# Patient Record
Sex: Male | Born: 1990 | Race: Black or African American | Hispanic: No | Marital: Married | Smoking: Current every day smoker
Health system: Southern US, Community
[De-identification: ages and names within clinical notes are randomized; demographics above are authoritative.]

---
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
## 2010-08-07 ENCOUNTER — Emergency Department: Payer: Self-pay | Admitting: Emergency Medicine

## 2011-02-04 ENCOUNTER — Emergency Department: Payer: Self-pay | Admitting: Emergency Medicine

## 2017-11-25 ENCOUNTER — Encounter: Payer: Self-pay | Admitting: Emergency Medicine

## 2017-11-25 ENCOUNTER — Emergency Department: Payer: No Typology Code available for payment source

## 2017-11-25 ENCOUNTER — Emergency Department
Admission: EM | Admit: 2017-11-25 | Discharge: 2017-11-25 | Disposition: A | Discharge status: Home / Self Care | Payer: No Typology Code available for payment source | Attending: Emergency Medicine | Admitting: Emergency Medicine

## 2017-11-25 DIAGNOSIS — Z23 Encounter for immunization: Secondary | ICD-10-CM | POA: Insufficient documentation

## 2017-11-25 DIAGNOSIS — Y9289 Other specified places as the place of occurrence of the external cause: Secondary | ICD-10-CM | POA: Insufficient documentation

## 2017-11-25 DIAGNOSIS — Y999 Unspecified external cause status: Secondary | ICD-10-CM | POA: Insufficient documentation

## 2017-11-25 DIAGNOSIS — W540XXA Bitten by dog, initial encounter: Secondary | ICD-10-CM | POA: Diagnosis not present

## 2017-11-25 DIAGNOSIS — F17210 Nicotine dependence, cigarettes, uncomplicated: Secondary | ICD-10-CM | POA: Diagnosis not present

## 2017-11-25 DIAGNOSIS — S62635B Displaced fracture of distal phalanx of left ring finger, initial encounter for open fracture: Secondary | ICD-10-CM | POA: Diagnosis not present

## 2017-11-25 DIAGNOSIS — S6992XA Unspecified injury of left wrist, hand and finger(s), initial encounter: Secondary | ICD-10-CM | POA: Diagnosis present

## 2017-11-25 DIAGNOSIS — S48929A Partial traumatic amputation of unspecified shoulder and upper arm, level unspecified, initial encounter: Secondary | ICD-10-CM

## 2017-11-25 DIAGNOSIS — Y9389 Activity, other specified: Secondary | ICD-10-CM | POA: Insufficient documentation

## 2017-11-25 DIAGNOSIS — S61452A Open bite of left hand, initial encounter: Secondary | ICD-10-CM

## 2017-11-25 MED ORDER — LIDOCAINE HCL (PF) 1 % IJ SOLN
5.0000 mL | Freq: Once | INTRAMUSCULAR | Status: AC
Start: 2017-11-25 — End: 2017-11-25
  Administered 2017-11-25: 5 mL via INTRADERMAL
  Filled 2017-11-25: qty 5

## 2017-11-25 MED ORDER — TETANUS-DIPHTH-ACELL PERTUSSIS 5-2.5-18.5 LF-MCG/0.5 IM SUSP
0.5000 mL | Freq: Once | INTRAMUSCULAR | Status: AC
  Administered 2017-11-25: 0.5 mL via INTRAMUSCULAR
  Filled 2017-11-25: qty 0.5

## 2017-11-25 MED ORDER — LORAZEPAM 2 MG/ML IJ SOLN
INTRAMUSCULAR | Status: AC
  Administered 2017-11-25: 1 mg via INTRAVENOUS
  Filled 2017-11-25: qty 1

## 2017-11-25 MED ORDER — HYDROCODONE-ACETAMINOPHEN 5-325 MG PO TABS: 1.0000 | ORAL_TABLET | Freq: Four times a day (QID) | ORAL | 0 refills | Status: AC | PRN

## 2017-11-25 MED ORDER — OXYCODONE-ACETAMINOPHEN 5-325 MG PO TABS
1.0000 | ORAL_TABLET | Freq: Once | ORAL | Status: AC
  Administered 2017-11-25: 1 via ORAL

## 2017-11-25 MED ORDER — BACITRACIN ZINC 500 UNIT/GM EX OINT
TOPICAL_OINTMENT | Freq: Once | CUTANEOUS | Status: DC
  Filled 2017-11-25: qty 0.9

## 2017-11-25 MED ORDER — LIDOCAINE HCL (PF) 1 % IJ SOLN
5.0000 mL | Freq: Once | INTRAMUSCULAR | Status: AC
  Administered 2017-11-25: 5 mL via INTRADERMAL
  Filled 2017-11-25: qty 5

## 2017-11-25 MED ORDER — AMPICILLIN-SULBACTAM SODIUM 3 (2-1) G IJ SOLR
3.0000 g | Freq: Once | INTRAMUSCULAR | Status: AC
  Administered 2017-11-25: 3 g via INTRAVENOUS
  Filled 2017-11-25: qty 3

## 2017-11-25 MED ORDER — LORAZEPAM 2 MG/ML IJ SOLN
1.0000 mg | Freq: Once | INTRAMUSCULAR | Status: AC
  Administered 2017-11-25: 1 mg via INTRAVENOUS

## 2017-11-25 MED ORDER — AMOXICILLIN-POT CLAVULANATE 875-125 MG PO TABS: 1.0000 | ORAL_TABLET | Freq: Two times a day (BID) | ORAL | 0 refills | Status: AC

## 2017-11-25 MED ORDER — ONDANSETRON HCL 4 MG/2ML IJ SOLN
4.0000 mg | Freq: Once | INTRAMUSCULAR | Status: AC
  Administered 2017-11-25: 4 mg via INTRAVENOUS
  Filled 2017-11-25: qty 2

## 2017-11-25 MED ORDER — OXYCODONE-ACETAMINOPHEN 5-325 MG PO TABS
ORAL_TABLET | ORAL | Status: AC
  Filled 2017-11-25: qty 1

## 2017-11-25 MED ORDER — MORPHINE SULFATE (PF) 4 MG/ML IV SOLN
4.0000 mg | Freq: Once | INTRAVENOUS | Status: AC
  Administered 2017-11-25: 4 mg via INTRAVENOUS
  Filled 2017-11-25: qty 1

## 2017-11-25 MED ORDER — MORPHINE SULFATE (PF) 4 MG/ML IV SOLN
4.0000 mg | Freq: Once | INTRAVENOUS | Status: DC
  Filled 2017-11-25: qty 1

## 2017-11-25 NOTE — Discharge Instructions (Addendum)
Follow-up with the hand surgeon for reevaluation next week.  He will need to call make an appointment.  It is highly important that you see the hand surgeon due to the extent of the injury from the dog bite.  It is possible you could still lose the distal tip of your finger.  That is why it is so important for you to follow-up with the hand surgeon. Take medication as prescribed.  You can take Tylenol and Advil for pain.  If this does not cover the pain you have a prescription for hydrocodone.  Be careful with the hydrocodone as it has an addictive quality.  You should not use her left hand at work until you are seen by orthopedics

## 2017-11-25 NOTE — ED Provider Notes (Signed)
[facility] Emergency Department Provider Note  ____________________________________________   First MD Initiated Contact with Patient 11/25/17 1828     (approximate)  I have reviewed the triage vital signs and the nursing notes.   HISTORY  Chief Complaint Animal Bite    HPI Paul Evans is a 27 y.o. male presents to the emergency department due to a dog bite to the left ring finger.  He states he was trying to break up dogs that were fighting.  It was his dog that bit his finger.  He states the dog's immunizations are already up-to-date.  He is unsure of his last tetanus shot.  He denies any other injuries at this time.  Animal control was notified.  History reviewed. No pertinent past medical history.  There are no active problems to display for this patient.   History reviewed. No pertinent surgical history.  Prior to Admission medications   Medication Sig Start Date End Date Taking? Authorizing Provider  amoxicillin-clavulanate (AUGMENTIN) 875-125 MG tablet Take 1 tablet by mouth 2 (two) times daily. 11/25/17   Ruth Kovich, Roselyn BeringSusan W, PA-C  HYDROcodone-acetaminophen (NORCO/VICODIN) 5-325 MG tablet Take 1 tablet by mouth every 6 (six) hours as needed for moderate pain. 11/25/17   Faythe GheeFisher, Dirk Vanaman W, PA-C    Allergies Patient has no known allergies.  No family history on file.  Social History Social History   Tobacco Use  . Smoking status: Current Every Day Smoker    Packs/day: 0.50    Types: Cigarettes  . Smokeless tobacco: Never Used  Substance Use Topics  . Alcohol use: Never    Frequency: Never  . Drug use: Not on file    Review of Systems  Constitutional: No fever/chills Eyes: No visual changes. ENT: No sore throat. Respiratory: Denies cough Genitourinary: Negative for dysuria. Musculoskeletal: Negative for back pain.  Positive for left fourth finger pain and laceration Skin: Negative for  rash.    ____________________________________________   PHYSICAL EXAM:  VITAL SIGNS: ED Triage Vitals  Enc Vitals Group     BP 11/25/17 1816 97/73     Pulse Rate 11/25/17 1816 100     Resp 11/25/17 1816 18     Temp 11/25/17 1816 99 F (37.2 C)     Temp Source 11/25/17 1816 Oral     SpO2 11/25/17 1816 95 %     Weight 11/25/17 1812 115 lb (52.2 kg)     Height 11/25/17 1812 5\' 2"  (1.575 m)     Head Circumference --      Peak Flow --      Pain Score 11/25/17 1811 9     Pain Loc --      Pain Edu? --      Excl. in [place]? --     Constitutional: Alert and oriented. Well appearing and in no acute distress. Eyes: Conjunctivae are normal.  Head: Atraumatic. Nose: No congestion/rhinnorhea. Mouth/Throat: Mucous membranes are moist.   Cardiovascular: Normal rate, regular rhythm.  Heart sounds are normal Respiratory: Normal respiratory effort.  No retractions, lungs are clear to auscultation GU: deferred Musculoskeletal: The left fourth finger has a deformity and near amputation of the distal phalanx.  There is a jagged laceration noted which is almost circumferential.  There is no foreign body noted.  Neurovascular is still intact Neurologic:  Normal speech and language.  Skin:  Skin is warm, dry.  The left fourth finger has a circumferential laceration to the distal phalanx  psychiatric: Mood and affect are  normal. Speech and behavior are normal.  ____________________________________________   LABS (all labs ordered are listed, but only abnormal results are displayed)  Labs Reviewed - No data to display ____________________________________________   ____________________________________________  RADIOLOGY  X-ray of the left fourth finger shows a distal phalanx fracture which is displaced  ____________________________________________   PROCEDURES  Procedure(s) performed:   Marland KitchenMarland KitchenLaceration Repair Date/Time: 11/25/2017 8:46 PM Performed by: Faythe Ghee, PA-C Authorized  by: Faythe Ghee, PA-C   Consent:    Consent obtained:  Verbal   Consent given by:  Patient   Risks discussed:  Infection, pain, retained foreign body, vascular damage, tendon damage, poor wound healing, poor cosmetic result, need for additional repair and nerve damage   Alternatives discussed:  No treatment Anesthesia (see MAR for exact dosages):    Anesthesia method:  Nerve block   Block needle gauge:  25 G   Block anesthetic:  Lidocaine 1% w/o epi   Block injection procedure:  Anatomic landmarks identified, introduced needle, incremental injection, negative aspiration for blood and anatomic landmarks palpated   Block outcome:  Anesthesia achieved Laceration details:    Location:  Finger   Finger location:  L ring finger   Length (cm):  2   Depth (mm):  5 Repair type:    Repair type:  Intermediate Pre-procedure details:    Preparation:  Patient was prepped and draped in usual sterile fashion and imaging obtained to evaluate for foreign bodies Exploration:    Hemostasis achieved with:  Direct pressure   Wound exploration: wound explored through full range of motion and entire depth of wound probed and visualized     Wound extent: underlying fracture     Wound extent: no foreign bodies/material noted, no nerve damage noted and no tendon damage noted     Contaminated: yes   Treatment:    Area cleansed with:  Betadine and saline   Amount of cleaning:  Extensive   Irrigation solution:  Sterile saline   Irrigation volume:  1 liter   Irrigation method:  Pressure wash, syringe and tap   Visualized foreign bodies/material removed: no   Skin repair:    Repair method:  Sutures   Suture size:  5-0   Suture material:  Nylon   Suture technique:  Simple interrupted   Number of sutures:  11 Approximation:    Approximation:  Close Post-procedure details:    Dressing:  Antibiotic ointment and non-adherent dressing   Patient tolerance of procedure:  Tolerated well, no immediate  complications Comments:     Finger splint also applied      ____________________________________________   INITIAL IMPRESSION / ASSESSMENT AND PLAN / ED COURSE  Pertinent labs & imaging results that were available during my care of the patient were reviewed by me and considered in my medical decision making (see chart for details).  Patient is 27 year old male presents emergency department with a dog bite to the left fourth finger.  It was his dog that bit him.  He states the dog shots are up-to-date.  Animal control was notified.  His tetanus was updated in the ED today.  On physical exam there is a near amputation of the distal left fourth finger.  There is a fair amount of bleeding noted.  Digital block was performed prior to x-ray.  Saline lock was inserted by the nurse.  X-ray of the left fourth finger shows a distal phalanx fracture which is displaced.  No foreign body noted  Explained the x-ray  results to the patient.  He becomes quite upset.  When attempting to suture he states he feels everything.  For milligrams of morphine and 1 mg of Ativan were given to the patient.  Additional Xylocaine was injected into the digital block.  Full anesthesia was acquired in the finger.  Patient states he feels much better.  11 simple sutures were inserted.  The bone was realigned.  He was neurovascularly intact with less than 1 second cap refill after suturing  Patient was also given 3 g of Unasyn.  He is to follow-up with orthopedics next week.  He was given a prescription for Augmentin 875 mg twice daily for 10 days.  Given prescription for Norco 5/325 #15 with no refill.  Explained to him that he could also take Tylenol or ibuprofen.  Even though [facility] is on call, explained to him that a hand surgeon would be more appropriate for this type of injury.  He is to call [facility] to see if he can make an appointment with a hand surgeon.  If he is unable to make an appointment  with them, he was given the phone number to follow-up with [facility] orthopedics.  He was given a work note limiting his duties.  He is not to use the left hand until he is released by orthopedics.  Once again explained to the patient that it is vitally important for him to follow-up with orthopedics.  There is still concern that he could lose the tip of this finger due to the amount of trauma that was induced by the dog.  He states he understands and will comply with our instructions.  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   HYDROCODONE-ACETAMINOPHEN (NORCO/VICODIN) 5-325 MG TABLET    Take 1 tablet by mouth every 6 (six) hours as needed for moderate pain.     Note:  This document was prepared using Dragon voice recognition software and may include unintentional dictation errors.    Faythe Ghee, PA-C 11/25/17 2053    Jeanmarie Plant, MD 11/25/17 [phone]

## 2017-11-25 NOTE — ED Triage Notes (Signed)
Patient presents to the ED with dog bite to his left hand.  Patient brought to the ED via EMS.  Patient is in no obvious distress at this time.

## 2017-11-25 NOTE — ED Notes (Signed)
X-ray at bedside

## 2017-11-25 NOTE — ED Notes (Signed)
Pt reports his dog ran out of his apartment when he opened the door after hearing other dogs barking.  He was trying to break up fight between the dogs and got bit on 4th digit, left hand. Thinks his dog was one that he got bite from but not 100% sure.

## 2017-11-25 NOTE — ED Notes (Signed)
Bite has already been reported to [employer] prior to coming.

## 2019-02-27 ENCOUNTER — Emergency Department: Payer: 59

## 2019-02-27 ENCOUNTER — Emergency Department
Admission: EM | Admit: 2019-02-27 | Discharge: 2019-02-27 | Disposition: A | Discharge status: Home / Self Care | Payer: 59 | Attending: Student in an Organized Health Care Education/Training Program | Admitting: Student in an Organized Health Care Education/Training Program

## 2019-02-27 ENCOUNTER — Other Ambulatory Visit: Payer: Self-pay

## 2019-02-27 DIAGNOSIS — S6991XA Unspecified injury of right wrist, hand and finger(s), initial encounter: Secondary | ICD-10-CM | POA: Diagnosis present

## 2019-02-27 DIAGNOSIS — Y939 Activity, unspecified: Secondary | ICD-10-CM | POA: Diagnosis not present

## 2019-02-27 DIAGNOSIS — F17210 Nicotine dependence, cigarettes, uncomplicated: Secondary | ICD-10-CM | POA: Insufficient documentation

## 2019-02-27 DIAGNOSIS — Y999 Unspecified external cause status: Secondary | ICD-10-CM | POA: Insufficient documentation

## 2019-02-27 DIAGNOSIS — M79644 Pain in right finger(s): Secondary | ICD-10-CM

## 2019-02-27 DIAGNOSIS — W231XXA Caught, crushed, jammed, or pinched between stationary objects, initial encounter: Secondary | ICD-10-CM | POA: Diagnosis not present

## 2019-02-27 DIAGNOSIS — S60011A Contusion of right thumb without damage to nail, initial encounter: Secondary | ICD-10-CM | POA: Insufficient documentation

## 2019-02-27 DIAGNOSIS — S6010XA Contusion of unspecified finger with damage to nail, initial encounter: Secondary | ICD-10-CM

## 2019-02-27 DIAGNOSIS — Y929 Unspecified place or not applicable: Secondary | ICD-10-CM | POA: Diagnosis not present

## 2019-02-27 MED ORDER — TRAMADOL HCL 50 MG PO TABS: 50.0000 mg | ORAL_TABLET | Freq: Four times a day (QID) | ORAL | 0 refills | Status: AC | PRN

## 2019-02-27 MED ORDER — HYDROCODONE-ACETAMINOPHEN 5-325 MG PO TABS
1.0000 | ORAL_TABLET | Freq: Once | ORAL | Status: AC
  Administered 2019-02-27: 05:00:00 1 via ORAL
  Filled 2019-02-27: qty 1

## 2019-02-27 NOTE — ED Notes (Signed)
Patient states that he used acetaminophen 2030hrs on 02/26/2019. He states he experienced no relief.

## 2019-02-27 NOTE — ED Provider Notes (Signed)
[facility] Emergency Department Provider Note    First MD Initiated Contact with Patient 02/27/19 [phone]     (approximate)  I have reviewed the triage vital signs and the nursing notes.   HISTORY  Chief Complaint Hand Injury    HPI Paul Evans is a 28 y.o. male presents the ER for evaluation of severe right thumb pain that occurred after the patient slammed his thumb in the car door.  Happened this evening.  Took some Tylenol without improvement.  Developed some bruising on the fingernail and feels significant pressure.  Movement worsens the pain.  Denies blood thinners.  Otherwise previously healthy.  History reviewed. No pertinent past medical history. No family history on file. History reviewed. No pertinent surgical history. There are no active problems to display for this patient.     Prior to Admission medications   Medication Sig Start Date End Date Taking? Authorizing Provider  amoxicillin-clavulanate (AUGMENTIN) 875-125 MG tablet Take 1 tablet by mouth 2 (two) times daily. 11/25/17   Fisher, Roselyn BeringSusan W, PA-C  HYDROcodone-acetaminophen (NORCO/VICODIN) 5-325 MG tablet Take 1 tablet by mouth every 6 (six) hours as needed for moderate pain. 11/25/17   Sherrie MustacheFisher, Roselyn BeringSusan W, PA-C    Allergies Shellfish allergy    Social History Social History   Tobacco Use  . Smoking status: Current Every Day Smoker    Packs/day: 0.50    Types: Cigarettes  . Smokeless tobacco: Never Used  Substance Use Topics  . Alcohol use: Never    Frequency: Never  . Drug use: Yes    Types: Marijuana    Review of Systems Patient denies headaches, rhinorrhea, blurry vision, numbness, shortness of breath, chest pain, edema, cough, abdominal pain, nausea, vomiting, diarrhea, dysuria, fevers, rashes or hallucinations unless otherwise stated above in HPI. ____________________________________________   PHYSICAL EXAM:  VITAL SIGNS: Vitals:   02/27/19 0451 02/27/19 0500   BP: 122/65 118/84  Pulse: (!) 59 60  Resp: 16 18  Temp:    SpO2: 98% 99%    Constitutional: Alert and oriented. Well appearing and in no acute distress. Eyes: Conjunctivae are normal.  Head: Atraumatic. Nose: No congestion/rhinnorhea. Mouth/Throat: Mucous membranes are moist.   Neck: Painless ROM.  Cardiovascular:   Good peripheral circulation. Respiratory: Normal respiratory effort.  No retractions.  Gastrointestinal: Soft and nontender.  Musculoskeletal: No lower extremity tenderness .  No joint effusions. Neurologic:  Normal speech and language. No gross focal neurologic deficits are appreciated.  Skin:  Skin is warm, dry and intact. No rash noted. Psychiatric: Mood and affect are normal. Speech and behavior are normal.  ____________________________________________   LABS (all labs ordered are listed, but only abnormal results are displayed)  No results found for this or any previous visit (from the past 24 hour(s)). ____________________________________________ ____________________________________________  RADIOLOGY  I personally reviewed all radiographic images ordered to evaluate for the above acute complaints and reviewed radiology reports and findings.  These findings were personally discussed with the patient.  Please see medical record for radiology report.  ____________________________________________   PROCEDURES  Procedure(s) performed:  Procedures    Critical Care performed: no ____________________________________________   INITIAL IMPRESSION / ASSESSMENT AND PLAN / ED COURSE  Pertinent labs & imaging results that were available during my care of the patient were reviewed by me and considered in my medical decision making (see chart for details).  DDX: No hematoma, fracture, laceration, ligamentous injury, bruise  Paul Evans is a 28 y.o. who presents to the ED  with subungual hematoma of the right thumb.  Patient significant pain.  X-rays did not  show any evidence of fracture.  No evidence of dislocation.  Neurovascular intact.  After discussion of the risks and benefits recommended trephination to decompress the hematoma.  Trephination was successfully completed with minimal drainage.  Patient was given oral pain medication.  Stable appropriate for outpatient follow-up.    The patient was evaluated in Emergency Department today for the symptoms described in the history of present illness. He/she was evaluated in the context of the global COVID-19 pandemic, which necessitated consideration that the patient might be at risk for infection with the SARS-CoV-2 virus that causes COVID-19. Institutional protocols and algorithms that pertain to the evaluation of patients at risk for COVID-19 are in a state of rapid change based on information released by regulatory bodies including the CDC and federal and state organizations. These policies and algorithms were followed during the patient's care in the ED.    ____________________________________________   FINAL CLINICAL IMPRESSION(S) / ED DIAGNOSES  Final diagnoses:  Thumb pain, right  Subungual hematoma of digit of hand, initial encounter      NEW MEDICATIONS STARTED DURING THIS VISIT:  New Prescriptions   No medications on file     Note:  This document was prepared using Dragon voice recognition software and may include unintentional dictation errors.     Merlyn Lot, MD 02/27/19 [phone]

## 2019-02-27 NOTE — ED Triage Notes (Addendum)
Pt to the er for pain to the thumb on the right hand. Pt says he slammed it in the car door and the door shut completely. Limited ROM in the affected thumb. Pt asking for pain meds. Offered tylenol. Pt declined. Pt states he has already taken 4 tylenol. Advised pt he could not get narcotics till after xray and a MD has seen the xray.

## 2019-12-05 IMAGING — CR RIGHT THUMB 2+V
1 series · 3 of 3 positions shown · non-contrast
Comparison: None.

CLINICAL DATA: Thumb injury, pain

EXAM:
RIGHT THUMB 2+V

[Series 1: dg finger thumb right · 0.14mm/px · 3 of 3 slices shown]
[im 1/3]
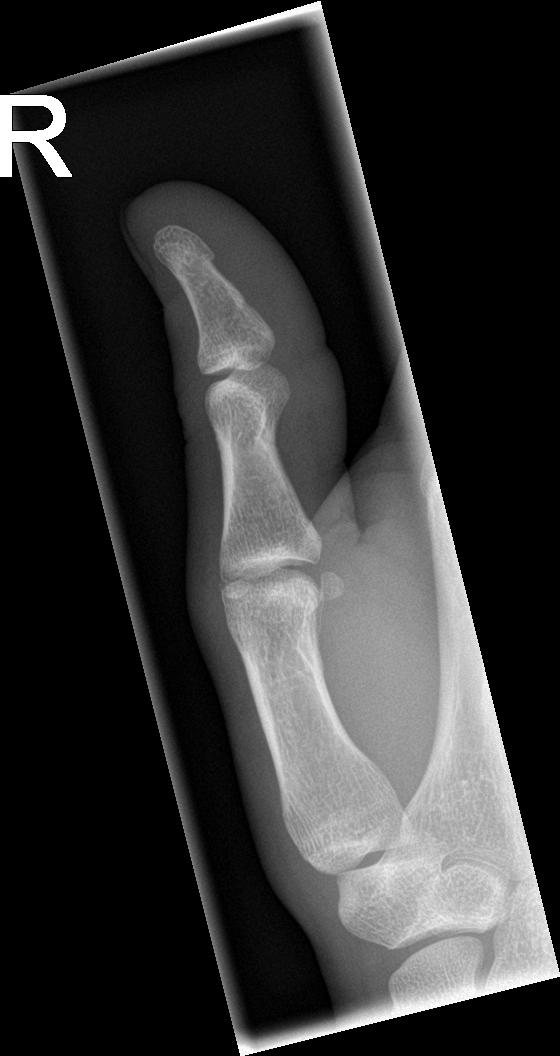
[im 2/3]
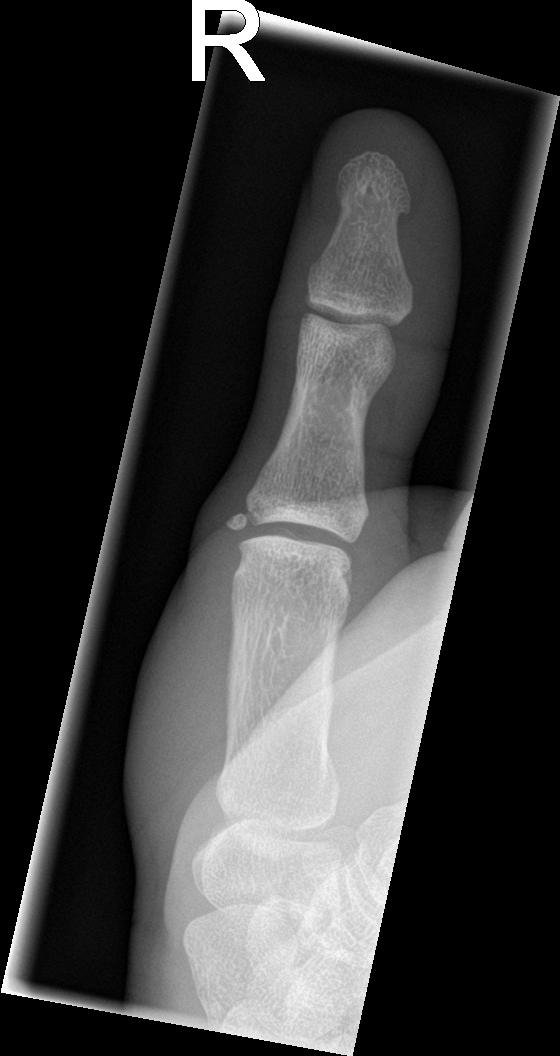
[im 3/3]
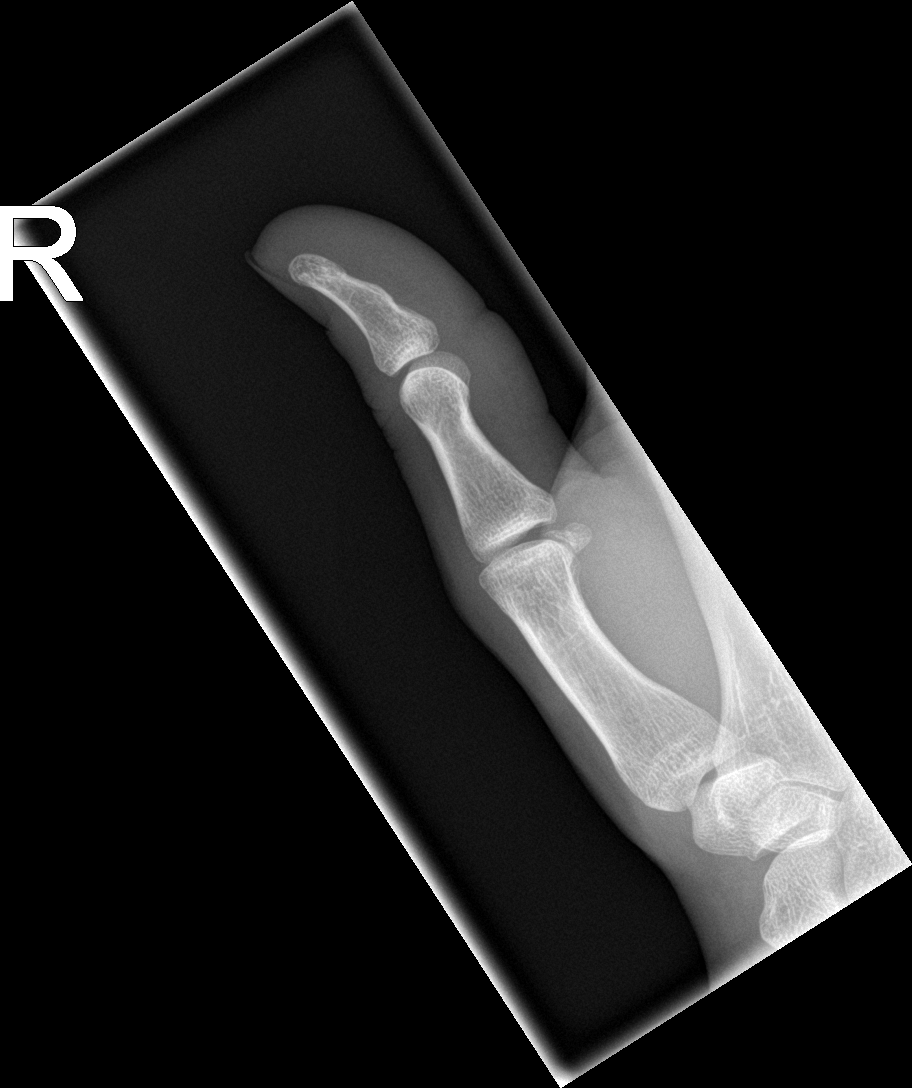

[3 of 3 positions shown; findings below may reference images not displayed]

FINDINGS: There is no evidence of fracture or dislocation. There is no
evidence of arthropathy or other focal bone abnormality. Soft
tissues are unremarkable
IMPRESSION: Negative.

## 2021-09-03 ENCOUNTER — Emergency Department
Admission: EM | Admit: 2021-09-03 | Discharge: 2021-09-03 | Disposition: A | Discharge status: Home / Self Care | Payer: Self-pay | Attending: Emergency Medicine | Admitting: Emergency Medicine

## 2021-09-03 ENCOUNTER — Other Ambulatory Visit: Payer: Self-pay

## 2021-09-03 ENCOUNTER — Encounter: Payer: Self-pay | Admitting: Emergency Medicine

## 2021-09-03 ENCOUNTER — Emergency Department: Payer: Self-pay

## 2021-09-03 DIAGNOSIS — M5412 Radiculopathy, cervical region: Secondary | ICD-10-CM

## 2021-09-03 MED ORDER — METHOCARBAMOL 500 MG PO TABS: 500.0000 mg | ORAL_TABLET | Freq: Three times a day (TID) | ORAL | 0 refills | Status: AC | PRN

## 2021-09-03 MED ORDER — PREDNISONE 10 MG (21) PO TBPK: ORAL_TABLET | ORAL | 0 refills | Status: AC

## 2021-09-03 MED ORDER — KETOROLAC TROMETHAMINE 30 MG/ML IJ SOLN
30.0000 mg | Freq: Once | INTRAMUSCULAR | Status: AC
  Administered 2021-09-03: 30 mg via INTRAMUSCULAR
  Filled 2021-09-03: qty 1

## 2021-09-03 NOTE — ED Provider Notes (Signed)
[facility] Provider Note  Patient Contact: 7:56 PM (approximate)   History   Numbness and Arm Pain   HPI  Paul Evans is a 31 y.o. male presents to the emergency department with neck pain that radiates into the left hand.  Patient describes pain as a burning and tingling sensation.  He denies falls or mechanisms of trauma.      Physical Exam   Triage Vital Signs: ED Triage Vitals  Enc Vitals Group     BP 09/03/21 1643 137/86     Pulse Rate 09/03/21 1643 (!) 112     Resp 09/03/21 1643 16     Temp 09/03/21 1643 99 F (37.2 C)     Temp Source 09/03/21 1643 Oral     SpO2 09/03/21 1643 97 %     Weight 09/03/21 1647 119 lb 14.9 oz (54.4 kg)     Height 09/03/21 1647 5\' 3"  (1.6 m)     Head Circumference --      Peak Flow --      Pain Score 09/03/21 1646 10     Pain Loc --      Pain Edu? --      Excl. in [place]? --     Most recent vital signs: Vitals:   09/03/21 1643 09/03/21 1941  BP: 137/86 133/82  Pulse: (!) 112 (!) 101  Resp: 16 20  Temp: 99 F (37.2 C)   SpO2: 97% 95%     General: Alert and in no acute distress. Eyes:  PERRL. EOMI. Head: No acute traumatic findings ENT:      Ears:       Nose: No congestion/rhinnorhea.      Mouth/Throat: Mucous membranes are moist. Neck: No stridor. No cervical spine tenderness to palpation.  Full range of motion.  No midline C-spine tenderness to palpation. Cardiovascular:  Good peripheral perfusion Respiratory: Normal respiratory effort without tachypnea or retractions. Lungs CTAB. Good air entry to the bases with no decreased or absent breath sounds. Gastrointestinal: Bowel sounds 4 quadrants. Soft and nontender to palpation. No guarding or rigidity. No palpable masses. No distention. No CVA tenderness. Musculoskeletal: Full range of motion to all extremities.  Neurologic:  No gross focal neurologic deficits are appreciated.  Skin:   No rash noted Other:   ED Results / Procedures / Treatments    Labs (all labs ordered are listed, but only abnormal results are displayed) Labs Reviewed - No data to display      RADIOLOGY  I personally viewed and evaluated these images as part of my medical decision making, as well as reviewing the written report by the radiologist.  ED Provider Interpretation: CT was personally reviewed by me.  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    MEDICATIONS ORDERED IN ED: Medications  ketorolac (TORADOL) 30 MG/ML injection 30 mg (30 mg Intramuscular Given 09/03/21 1936)     IMPRESSION / MDM / ASSESSMENT AND PLAN / ED COURSE  I reviewed the triage vital signs and the nursing notes.                              Assessment and Plan: Neck pain:  Differential diagnosis includes, but is not limited to, cervical radiculopathy, disc extrusion, spinal cord impingement, cervical stenosis  31 year old male presents to the emergency department with neck pain that radiates to the left upper extremity  with pain characterizes burning and tingling.  CT shows broad-based osteophytes at C5-C6 with mild canal stenosis and left neural foraminal encroachment.  Suspect cervical radiculopathy.  We will treat with tapered prednisone and Robaxin and have patient follow-up with neurosurgery.  Patient was given an injection of Toradol prior to discharge.     FINAL CLINICAL IMPRESSION(S) / ED DIAGNOSES   Final diagnoses:  Cervical radiculopathy     Rx / DC Orders   ED Discharge Orders          Ordered    predniSONE (STERAPRED UNI-PAK 21 TAB) 10 MG (21) TBPK tablet        09/03/21 1925    methocarbamol (ROBAXIN) 500 MG tablet  Every 8 hours PRN        09/03/21 1925             Note:  This document was prepared using Dragon voice recognition software and may include unintentional dictation errors.   Vallarie Mare [place], PA-C 09/03/21 2002    Lucrezia Starch, MD 09/03/21  2004

## 2021-09-03 NOTE — ED Triage Notes (Signed)
Pt comes into the ED via POV c/o left arm numbness that has been intermittent x 2 weeks.  Pt admits to pain on the upper back and neck that started the problem.  Pt is ambulatory to triage and in NAD.  No unilateral weakness or slurred speech.  Pt has even and unlabored respirations.

## 2021-09-03 NOTE — Discharge Instructions (Addendum)
Take tapered steroid as directed. You can also take muscle relaxers for pain. Please make follow-up appointment with neurosurgery.
